# Patient Record
Sex: Male | Born: 1990 | Race: White | Hispanic: No | Marital: Single | Smoking: Never smoker
Health system: Southern US, Community
[De-identification: ages and names within clinical notes are randomized; demographics above are authoritative.]

---

## 2011-06-17 ENCOUNTER — Ambulatory Visit
Admission: RE | Admit: 2011-06-17 | Discharge: 2011-06-17 | Disposition: A | Discharge status: Home / Self Care | Payer: BC Managed Care – PPO | Source: Ambulatory Visit | Attending: Family Medicine | Admitting: Family Medicine

## 2011-06-17 ENCOUNTER — Other Ambulatory Visit: Payer: Self-pay | Admitting: Family Medicine

## 2011-06-17 DIAGNOSIS — R52 Pain, unspecified: Secondary | ICD-10-CM

## 2011-06-30 ENCOUNTER — Ambulatory Visit (INDEPENDENT_AMBULATORY_CARE_PROVIDER_SITE_OTHER): Payer: BC Managed Care – PPO | Admitting: Internal Medicine

## 2011-06-30 ENCOUNTER — Encounter: Payer: Self-pay | Admitting: Internal Medicine

## 2011-06-30 ENCOUNTER — Other Ambulatory Visit (INDEPENDENT_AMBULATORY_CARE_PROVIDER_SITE_OTHER): Payer: BC Managed Care – PPO

## 2011-06-30 DIAGNOSIS — R0609 Other forms of dyspnea: Secondary | ICD-10-CM

## 2011-06-30 DIAGNOSIS — R0989 Other specified symptoms and signs involving the circulatory and respiratory systems: Secondary | ICD-10-CM

## 2011-06-30 DIAGNOSIS — R0600 Dyspnea, unspecified: Secondary | ICD-10-CM

## 2011-06-30 DIAGNOSIS — R079 Chest pain, unspecified: Secondary | ICD-10-CM

## 2011-06-30 LAB — BRAIN NATRIURETIC PEPTIDE: Pro B Natriuretic peptide (BNP): 4 pg/mL (ref 0.0–100.0)

## 2011-06-30 MED ORDER — PANTOPRAZOLE SODIUM 40 MG PO TBEC: 40.0000 mg | DELAYED_RELEASE_TABLET | Freq: Every day | ORAL | Status: DC

## 2011-06-30 MED ORDER — FAMOTIDINE 20 MG PO TABS: ORAL_TABLET | ORAL | Status: DC

## 2011-06-30 NOTE — Progress Notes (Signed)
  Subjective:    Patient ID: Jeffrey Ponce, male    DOB: 1991/06/21, 20 y.o.   MRN: [id number]  HPI  73 yowm never smoker with excellent ex tolerance entire life including competitive aerobics until abupt onset sob on Jun 10 2011 referred by Dr Yetta Barre for pulmonary eval   06/30/2011 1st pulmonary eval cc new acute onset last week  in Oct 29th nausea / light headed limited usual  Lifting assoc nausea and then persistent  Fatigue - nausea resolved unless resumed wt lifting , also couldn't run the usual 12 x 40 yards,   3 x 60 yards> same problem.  Then for several days Prior to day of OV   chest discomfort anteriorly worse with cough x sev minutes no nausea no sweating.  Sob occursmore  talking and lying down, not so much sitting still and not proportionate to activity.no overt hb or sinus symptoms   Played baseball all fall with intense/duration wts starting early October  Sleeping ok without nocturnal  or early am exacerbation  of respiratory  c/o'sAlso denies any obvious fluctuation of symptoms with weather or environmental changes or other aggravating or alleviating factors except as outlined above    Review of Systems  Constitutional: Negative for fever, chills, activity change, appetite change and unexpected weight change.  HENT: Negative for congestion, sore throat, rhinorrhea, sneezing, trouble swallowing, dental problem, voice change and postnasal drip.   Eyes: Negative for visual disturbance.  Respiratory: Positive for shortness of breath. Negative for choking.   Cardiovascular: Positive for chest pain. Negative for leg swelling.  Gastrointestinal: Positive for abdominal pain. Negative for nausea and vomiting.  Genitourinary: Negative for difficulty urinating.  Musculoskeletal: Negative for arthralgias.  Skin: Positive for rash.  Psychiatric/Behavioral: Negative for behavioral problems and confusion.       Objective:   Physical Exam  Anxious wm nad  Wt 160 06/30/2011    HEENT: nl dentition, turbinates, and orophanx. Nl external ear canals without cough reflex   NECK :  without JVD/Nodes/TM/ nl carotid upstrokes bilaterally   LUNGS: no acc muscle use, clear to A and P bilaterally without cough on insp or exp maneuvers   CV:  RRR  no s3 or murmur or increase in P2, no edema   ABD:  soft and nontender with nl excursion in the supine position. No bruits or organomegaly, bowel sounds nl  MS:  warm without deformities, calf tenderness, cyanosis or clubbing    06/13/11 labs nl including cbc, bmet and  tsh  06/17/11 CTangiogram Chest  wnl       Assessment & Plan:

## 2011-06-30 NOTE — Patient Instructions (Signed)
Try protonix 40 mg (generic ok) Take 30-60 min before first meal of the day and Pepcid 20 mg one bedtime for at least a month   GERD (REFLUX)  is an extremely common cause of respiratory symptoms, many times with no significant heartburn at all.    It can be treated with medication, but also with lifestyle changes including avoidance of late meals, excessive alcohol, smoking cessation, and avoid fatty foods, chocolate, peppermint, colas, red wine, and acidic juices such as orange juice.  NO MINT OR MENTHOL PRODUCTS SO NO COUGH DROPS  USE SUGARLESS CANDY INSTEAD (jolley ranchers or Stover's)  NO OIL BASED VITAMINS - use powdered substitutes.    If not satisfied x 2 weeks then the next step is CPST with lung function tests before and after - call this number to schedule [phone] and ask for [facility] to resume exercise but never to the point where you are out of breath and push to 30 minutes

## 2011-06-30 NOTE — Assessment & Plan Note (Signed)
Nl labs including cbc, nl ekg p walk x 3 laps here, not able to reproduce symptoms but not typically occuring walking.   Symptoms are markedly disproportionate to objective findings and not clear this is a lung problem but pt does appear to have difficult airway management issues.  DDX of  difficult airways managment all start with A and  include Adherence, Ace Inhibitors, Acid Reflux, Active Sinus Disease, Alpha 1 Antitripsin deficiency, Anxiety masquerading as Airways dz,  ABPA,  allergy(esp in young), Aspiration (esp in elderly), Adverse effects of DPI,  Active smokers, plus two Bs  = Bronchiectasis and Beta blocker use..and one C= CHF  Acid reflux related to valsalva during ex the most likely explanation for these symptoms with anxiety also a concern but a dx of exclusion here  See instructions for specific recommendations which were reviewed directly with the patient who was given a copy with highlighter outlining the key components. cpst p minimum of 2 weeks acid suppression.

## 2011-07-01 ENCOUNTER — Encounter: Payer: Self-pay | Admitting: *Deleted

## 2011-07-01 NOTE — Progress Notes (Signed)
Quick Note:  ATC pt x 2 -unable to reach so mailed letter. ______

## 2011-07-09 ENCOUNTER — Telehealth: Payer: Self-pay | Admitting: Internal Medicine

## 2011-07-09 NOTE — Telephone Encounter (Signed)
Pt called back.  Spoke with patient who c/o severe HA, nausea, diarrhea, joint pain, body aches, SOB and chills approx 2 days after starting the pepcid and protonix prescribed at last ov on 11.19.12.  Pt stated he has not taken either med today and symptoms have persisted.  Pt also reports that he has been taking in plenty of fluids to prevent dehydration.  MW gone for the day, will for to doc of the day.  KC please advise, thanks.

## 2011-07-09 NOTE — Telephone Encounter (Signed)
Pt requests to be contacted by someone today.  Jeffrey Ponce

## 2011-07-09 NOTE — Telephone Encounter (Signed)
He can stop the medication for acid reflux He may just have an acute viral illness.  Push fluids, bland diet to keep strength up, take tylenol or advil for discomfort and any fever. Let us know if not improving next 48 hrs, and if worsens during the night go to ER. Please cc this message to Dr. Sherene Sires.

## 2011-07-09 NOTE — Telephone Encounter (Signed)
ATC x5, line busy.  Called spoke with pt's father to either get alternate number or relay message.  Father gave me girlfriend's number to try as they are most likely together.  Called pt's gf, and was able to speak with patient.  Advised of KC's recs to push fluids, bland diet, tylenol/advil for aches/fever and to call if no better by Friday and go to ER if worsens over night.  Pt verbalized his understanding and denied any questions.  Pt did ask that MW be made aware that he has been unable to exercise d/t these symptoms.  Will sign off on message and forward to MW as Lorain Childes.

## 2011-07-09 NOTE — Telephone Encounter (Signed)
ATC pt x2, line busy 

## 2011-07-10 ENCOUNTER — Telehealth: Payer: Self-pay | Admitting: Internal Medicine

## 2011-07-10 DIAGNOSIS — R0600 Dyspnea, unspecified: Secondary | ICD-10-CM

## 2011-07-10 NOTE — Telephone Encounter (Signed)
I had tried calling him with lab results and had to mail him a letter- labs were normal. Instructions were if not improving to call for CPST, but since he stopped PPI therapy unsure what MW will want to do- ATC pt at the number provided and had to [facility].

## 2011-07-10 NOTE — Telephone Encounter (Signed)
Called and spoke with pt.  Informed him of lab results.  Pt verbalized understanding.  Pt states he wanted to know what MW's next steps were regarding testing, etc since he stopped PPI therapy. Pt did want to note that while on PPI therapy he stated he saw no changes in his breathing- no better or worse.  Pt aware MW out of office until tomorrow and is ok to wait for a response until he returns.  MW, please advise.

## 2011-07-11 NOTE — Telephone Encounter (Signed)
Could not tolerate ppi or pepcid > sleepy, drugged feeling, nausea and vomiting.  Resolved off rx  Next step is cpst with before and after pft's but we'll have to do this s treating the reflux first - not ideal but no other option

## 2011-07-11 NOTE — Telephone Encounter (Signed)
Pt calling again in reference to previous message.Jeffrey Ponce

## 2011-07-17 ENCOUNTER — Ambulatory Visit (HOSPITAL_COMMUNITY): Payer: BC Managed Care – PPO | Attending: Internal Medicine

## 2011-07-17 ENCOUNTER — Other Ambulatory Visit (HOSPITAL_COMMUNITY): Payer: Self-pay | Admitting: Radiology

## 2011-07-17 DIAGNOSIS — R0600 Dyspnea, unspecified: Secondary | ICD-10-CM

## 2011-07-17 DIAGNOSIS — R0602 Shortness of breath: Secondary | ICD-10-CM

## 2011-07-18 ENCOUNTER — Ambulatory Visit (HOSPITAL_COMMUNITY): Payer: BC Managed Care – PPO | Attending: Cardiology | Admitting: Radiology

## 2011-07-18 DIAGNOSIS — I079 Rheumatic tricuspid valve disease, unspecified: Secondary | ICD-10-CM | POA: Insufficient documentation

## 2011-07-18 DIAGNOSIS — I059 Rheumatic mitral valve disease, unspecified: Secondary | ICD-10-CM | POA: Insufficient documentation

## 2011-07-18 DIAGNOSIS — R0609 Other forms of dyspnea: Secondary | ICD-10-CM | POA: Insufficient documentation

## 2011-07-18 DIAGNOSIS — R0600 Dyspnea, unspecified: Secondary | ICD-10-CM

## 2011-07-18 DIAGNOSIS — R0989 Other specified symptoms and signs involving the circulatory and respiratory systems: Secondary | ICD-10-CM

## 2011-07-21 ENCOUNTER — Telehealth: Payer: Self-pay | Admitting: Internal Medicine

## 2011-07-21 ENCOUNTER — Encounter (HOSPITAL_COMMUNITY): Payer: Self-pay | Admitting: Sports Medicine

## 2011-07-21 NOTE — Telephone Encounter (Signed)
Echo faxed to [facility] @ [phone] 07/21/11/km

## 2011-07-30 ENCOUNTER — Telehealth (HOSPITAL_COMMUNITY): Payer: Self-pay | Admitting: *Deleted

## 2011-07-30 DIAGNOSIS — R0600 Dyspnea, unspecified: Secondary | ICD-10-CM

## 2011-07-30 NOTE — Telephone Encounter (Signed)
Per Dr Gala Romney pt needs cardiac CT, spoke w/pt last week and he has already went home for the holidays and will return 12/26, will scheduled CT for afgter that if Shirlee Latch and Eden Emms are on vacation then Dr Milana Na will do CT

## 2011-08-08 ENCOUNTER — Ambulatory Visit (HOSPITAL_COMMUNITY)
Admission: RE | Admit: 2011-08-08 | Discharge: 2011-08-08 | Disposition: A | Discharge status: Home / Self Care | Payer: BC Managed Care – PPO | Source: Ambulatory Visit | Attending: Internal Medicine | Admitting: Internal Medicine

## 2011-08-08 DIAGNOSIS — R0602 Shortness of breath: Secondary | ICD-10-CM

## 2011-08-08 DIAGNOSIS — R0600 Dyspnea, unspecified: Secondary | ICD-10-CM

## 2011-08-08 DIAGNOSIS — R079 Chest pain, unspecified: Secondary | ICD-10-CM

## 2011-08-08 MED ORDER — METOPROLOL TARTRATE 1 MG/ML IV SOLN
5.0000 mg | INTRAVENOUS | Status: DC | PRN
  Administered 2011-08-08 (×2): 5 mg via INTRAVENOUS
  Filled 2011-08-08: qty 5

## 2011-08-08 MED ORDER — METOPROLOL TARTRATE 1 MG/ML IV SOLN
INTRAVENOUS | Status: AC
  Administered 2011-08-08: 5 mg via INTRAVENOUS
  Filled 2011-08-08: qty 20

## 2011-08-08 MED ORDER — NITROGLYCERIN 0.4 MG SL SUBL
SUBLINGUAL_TABLET | SUBLINGUAL | Status: AC
  Administered 2011-08-08: 0.4 mg
  Filled 2011-08-08: qty 25

## 2011-08-08 MED ORDER — IOHEXOL 350 MG/ML SOLN
80.0000 mL | Freq: Once | INTRAVENOUS | Status: AC | PRN
  Administered 2011-08-08: 80 mL via INTRAVENOUS

## 2011-08-28 ENCOUNTER — Ambulatory Visit (INDEPENDENT_AMBULATORY_CARE_PROVIDER_SITE_OTHER): Payer: BC Managed Care – PPO | Admitting: Pulmonary Disease

## 2011-08-28 ENCOUNTER — Encounter: Payer: Self-pay | Admitting: Pulmonary Disease

## 2011-08-28 VITALS — BP 122/64 | HR 77 | Temp 98.1°F | Ht 69.0 in | Wt 162.8 lb

## 2011-08-28 DIAGNOSIS — R0989 Other specified symptoms and signs involving the circulatory and respiratory systems: Secondary | ICD-10-CM

## 2011-08-28 DIAGNOSIS — R0600 Dyspnea, unspecified: Secondary | ICD-10-CM

## 2011-08-28 NOTE — Progress Notes (Signed)
  Subjective:    Patient ID: Jeffrey Ponce, male    DOB: 22-Mar-1991, 20 y.o.   MRN: [id number]  HPI Patient is a 21 year old male who I've been asked to see for dyspnea.  He was in his usual state of health and doing well until October of last year when he began to have dyspnea on exertion.  This worsened to the point that he was dyspneic even at rest.  The patient has had a cardiopulmonary stress test which showed a decrease in his flows immediately after exercise, but then improved over time.  Typically, exercise-induced asthma shows a decline approximately 10-15 minutes after exercise.  He has had an echocardiogram which showed an ejection fraction of 45-50%, and this was followed by a cardiac CT that was unremarkable.  He has also had a negative CT chest in November of last year.  The patient was treated for possible exercised induced asthma with albuterol as needed.  There was a question of mold exposure in his environment, and since moving to a different setting he feels that he has improved by 50%.  However, he still gets dyspneic even with a slow jog.  He is using his albuterol before exercise, and also as needed.  Unfortunately, he is using this 4-5 times a day.  He thinks the inhaler helps merrily when he is doing intense exercise.  The patient denies any history of cough or mucus production, and has nothing to suggest muscle weakness.  He has no history of childhood asthma.   Review of Systems  Constitutional: Negative.  Negative for fever and unexpected weight change.  HENT: Negative.  Negative for ear pain, nosebleeds, congestion, sore throat, rhinorrhea, sneezing, trouble swallowing, dental problem, postnasal drip and sinus pressure.   Eyes: Negative.  Negative for redness and itching.  Respiratory: Positive for chest tightness and shortness of breath. Negative for cough and wheezing.   Cardiovascular: Negative.  Negative for palpitations and leg swelling.  Gastrointestinal: Negative.   Negative for nausea and vomiting.  Genitourinary: Negative.  Negative for dysuria.  Musculoskeletal: Negative.  Negative for joint swelling.  Skin: Negative.  Negative for rash.  Neurological: Negative.  Negative for headaches.  Hematological: Negative.  Does not bruise/bleed easily.  Psychiatric/Behavioral: Negative.  Negative for dysphoric mood. The patient is not nervous/anxious.        Objective:   Physical Exam Constitutional:  Well developed, no acute distress  HENT:  Nares patent without discharge  Oropharynx without exudate, palate and uvula are normal  Eyes:  Perrla, eomi, no scleral icterus  Neck:  No JVD, no TMG  Cardiovascular:  Normal rate, regular rhythm, no rubs or gallops.  No murmurs        Intact distal pulses  Pulmonary :  Normal breath sounds, no stridor or respiratory distress   No rales, rhonchi, or wheezing  Abdominal:  Soft, nondistended, bowel sounds present.  No tenderness noted.   Musculoskeletal:  No lower extremity edema noted.  Lymph Nodes:  No cervical lymphadenopathy noted  Skin:  No cyanosis noted  Neurologic:  Alert, appropriate, moves all 4 extremities without obvious deficit.         Assessment & Plan:

## 2011-08-28 NOTE — Patient Instructions (Signed)
Will schedule for methacholine challenge testing. Will call with results when available. Can use albuterol up to 12hrs before testing.

## 2011-08-28 NOTE — Assessment & Plan Note (Signed)
The patient has a history that is somewhat suggestive of exercise-induced asthma, however spirometry during his cardiopulmonary stress test is really not diagnostic. He feels that his breathing has definitely improved since being out of his environment with mold, but is still using his albuterol inhaler 4-5 times per day.  I am most concerned that he may have true asthma, which will require more aggressive treatment with inhaled corticosteroids for life.  The other possibility is that he developed reactive airways disease when being in the mold environment, and now is improving since in a different environment?  I really think we need to put the issue of asthma to rest, and would therefore recommend a methacholine challenge test.  The patient is agreeable to this approach.  I am still a little puzzled by his echo which showed an ejection fraction of 45-50%.  He has had a negative cardiac CT, but I do not know whether this truly addresses the ejection fraction issue.  I will leave that to his primary care physician.

## 2011-09-03 ENCOUNTER — Other Ambulatory Visit (HOSPITAL_COMMUNITY): Payer: Self-pay | Admitting: Radiology

## 2011-09-03 ENCOUNTER — Ambulatory Visit (HOSPITAL_COMMUNITY)
Admission: RE | Admit: 2011-09-03 | Discharge: 2011-09-03 | Disposition: A | Discharge status: Home / Self Care | Payer: BC Managed Care – PPO | Source: Ambulatory Visit | Attending: Pulmonary Disease | Admitting: Pulmonary Disease

## 2011-09-03 DIAGNOSIS — R0600 Dyspnea, unspecified: Secondary | ICD-10-CM

## 2011-09-03 DIAGNOSIS — R0609 Other forms of dyspnea: Secondary | ICD-10-CM | POA: Insufficient documentation

## 2011-09-03 DIAGNOSIS — R0989 Other specified symptoms and signs involving the circulatory and respiratory systems: Secondary | ICD-10-CM | POA: Insufficient documentation

## 2011-09-03 MED ORDER — ALBUTEROL SULFATE (5 MG/ML) 0.5% IN NEBU
2.5000 mg | INHALATION_SOLUTION | Freq: Once | RESPIRATORY_TRACT | Status: AC
  Administered 2011-09-03: 2.5 mg via RESPIRATORY_TRACT

## 2011-09-03 MED ORDER — METHACHOLINE 0.25 MG/ML NEB SOLN
2.0000 mL | Freq: Once | RESPIRATORY_TRACT | Status: AC
  Administered 2011-09-03: 0.5 mg via RESPIRATORY_TRACT

## 2011-09-03 MED ORDER — SODIUM CHLORIDE 0.9 % IN NEBU
3.0000 mL | INHALATION_SOLUTION | Freq: Once | RESPIRATORY_TRACT | Status: AC
  Administered 2011-09-03: 3 mL via RESPIRATORY_TRACT

## 2011-09-03 MED ORDER — METHACHOLINE 0.0625 MG/ML NEB SOLN
2.0000 mL | Freq: Once | RESPIRATORY_TRACT | Status: AC
  Administered 2011-09-03: 0.125 mg via RESPIRATORY_TRACT

## 2011-09-03 MED ORDER — METHACHOLINE 16 MG/ML NEB SOLN: 2.0000 mL | Freq: Once | RESPIRATORY_TRACT | Status: AC

## 2011-09-03 MED ORDER — METHACHOLINE 4 MG/ML NEB SOLN
2.0000 mL | Freq: Once | RESPIRATORY_TRACT | Status: AC
  Administered 2011-09-03: 8 mg via RESPIRATORY_TRACT

## 2011-09-03 MED ORDER — METHACHOLINE 1 MG/ML NEB SOLN
2.0000 mL | Freq: Once | RESPIRATORY_TRACT | Status: AC
  Administered 2011-09-03: 2 mg via RESPIRATORY_TRACT

## 2013-09-19 ENCOUNTER — Encounter: Payer: Self-pay | Admitting: Sports Medicine

## 2013-09-19 ENCOUNTER — Ambulatory Visit (INDEPENDENT_AMBULATORY_CARE_PROVIDER_SITE_OTHER): Payer: Self-pay | Admitting: Sports Medicine

## 2013-09-19 VITALS — BP 127/74 | Ht 69.0 in | Wt 160.0 lb

## 2013-09-19 DIAGNOSIS — Z0000 Encounter for general adult medical examination without abnormal findings: Secondary | ICD-10-CM

## 2013-09-19 NOTE — Progress Notes (Addendum)
   Subjective:    Patient ID: Jeffrey Ponce, male    DOB: 11/24/1990, 23 y.o.   MRN: [id number]  HPI chief complaint: "I need some paperwork filled out for [place] [employer]"  23 year old healthy male comes in today requesting some paperwork to be completed. He is applying for employment with [employer]. He is scheduled to start July 1. 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 He is currently without complaint. He is otherwise healthy. He does have a history of dyspnea in late 2012. He was evaluated by a pulmonologist and although no specific diagnosis was given, his symptoms eventually resolved. In fact, he was able to complete a 7 mile run a few days ago without any difficulty. No longer getting shortness of breath. No fevers. No chest pain. Surgical history is significant for an ORIF of a distal radius fracture in 2009. He takes no chronic medications He is allergic to pantoprazole    Review of Systems     Objective:   Physical Exam Well-developed, fit-appearing. No acute distress. Awake alert and oriented x3. Blood pressure is 127/74.  HEENT: Normocephalic, atraumatic. Pupils equal, round, and reactive to light and accommodation Cardiovascular: Regular rate. No murmurs, rubs, or gallops. Lungs: Clear to auscultation bilaterally. No rhonchi, rales, or wheezes. Abdomen: Benign Extremities: No edema MSK: Patient has full range of motion and full-strength in all joints.       Assessment & Plan:  Healthy 23 year old male  [place] [employer] pre[place] physician statement form was completed and is available for review on the chart. No restrictions on activity. Followup when necessary.

## 2013-11-23 IMAGING — CT CT HEART MORP W/ CTA COR W/ SCORE W/ CA W/CM &/OR W/O CM
1 of 5 series · 11 of 20 positions shown, 14 images · non-contrast
Comparison: CT 06/17/2011.

<!--  IDXRADR:ADDEND:BEGIN -->Addendum Begins
<!--  IDXRADR:ADDEND:INNER_BEGIN -->***ADDENDUM*** CREATED: 08/14/2011 [DATE]

OVER-READ INTERPRETATION - CT CHEST
The following report is an over-read performed by radiologist Dr.
[DATE].  This over-read does not include interpretation of
cardiac or coronary anatomy or pathology.  The CTA interpretation
by the cardiologist is attached.
PROTOCOL: The patient scanned on a [employer]ips [REDACTED]ice scanner.
Prospective gating with 5% phase tolerance at 100 kV was used.   15
mg IV total of Lopressor was administered.  Average heart rate
during the scan was 60 beats per minute.  After an initial AP and
lateral topogram, 3 mm axial slices were performed through the
heart for calcium scoring. The patient then had a 80 ml bolus of
contrast given for coronary CTA with bolus tracking in the
descending thoracic aorta.  The 3-D data set was then sent to the
[employer] workstation.  Reconstructions were done using MIP,MPR and
VRT modes.

[Series 8: w/ edge cor., 78.0% · axial · 0.49mm/px · z∈[-215,-112]mm · 11 of 276 slices shown, 14 images]
[im 23/276  vessel]
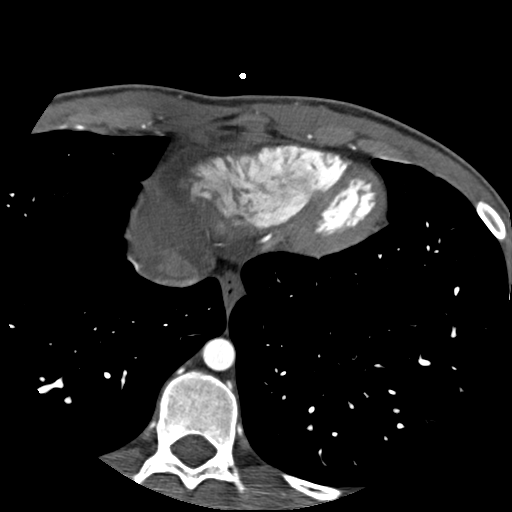
[im 23/276  lung]
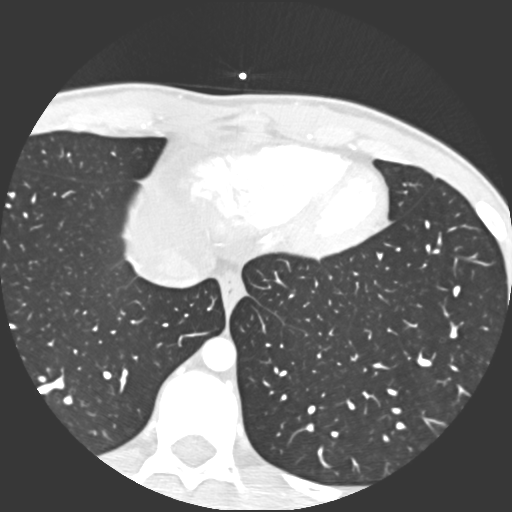
[im 46/276  vessel]
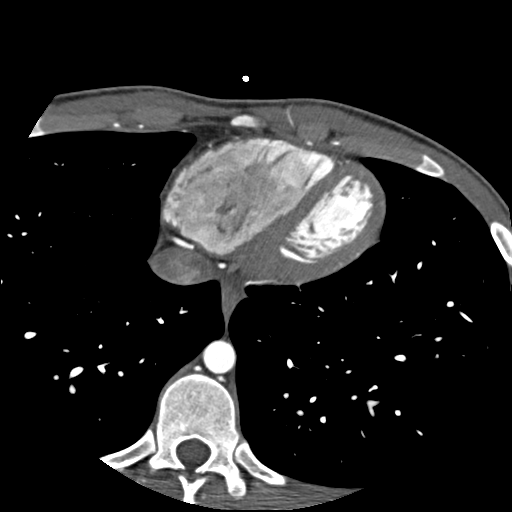
[im 69/276  vessel]
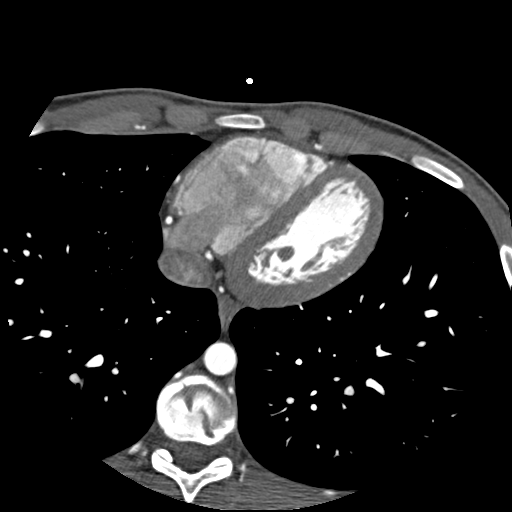
[im 92/276  vessel]
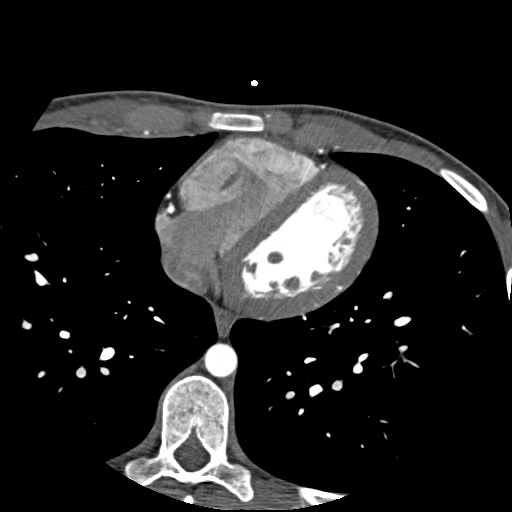
[im 115/276  vessel]
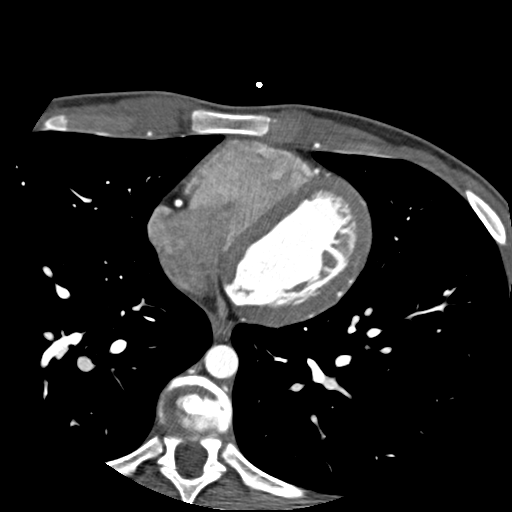
[im 115/276  lung]
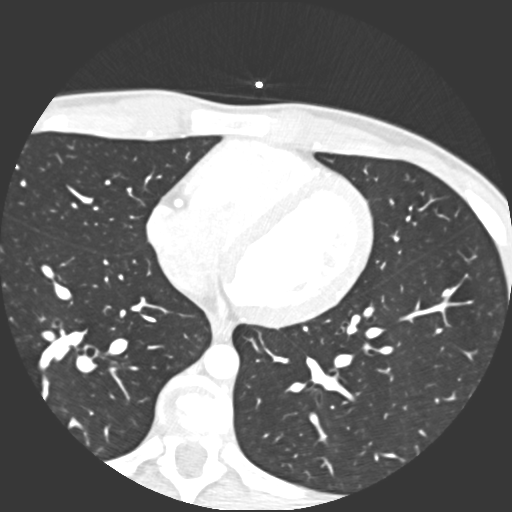
[im 138/276  vessel]
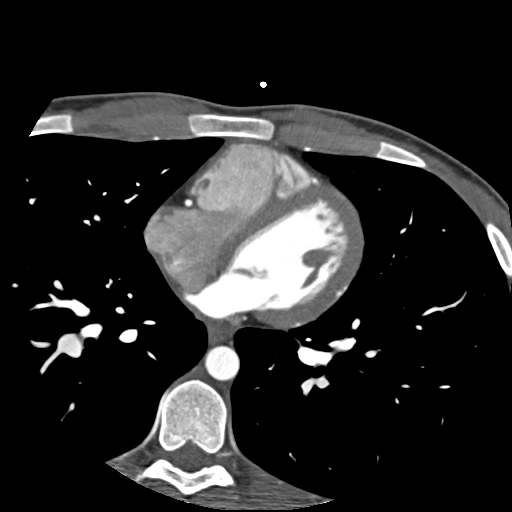
[im 161/276  vessel]
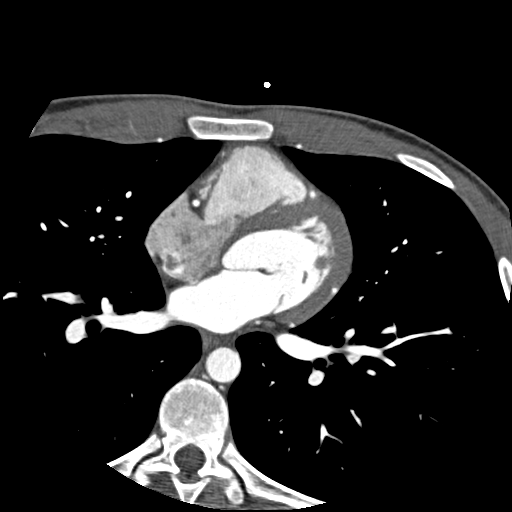
[im 184/276  vessel]
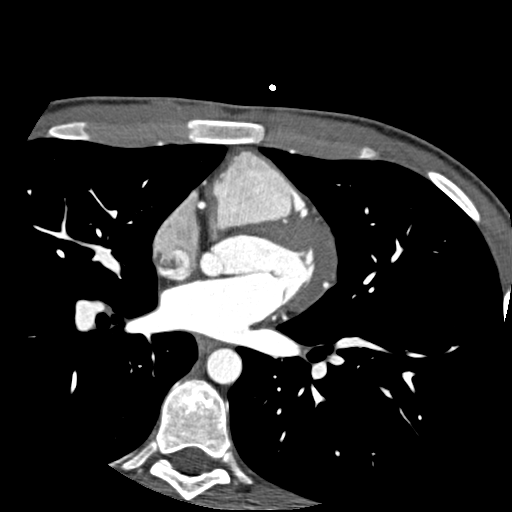
[im 207/276  vessel]
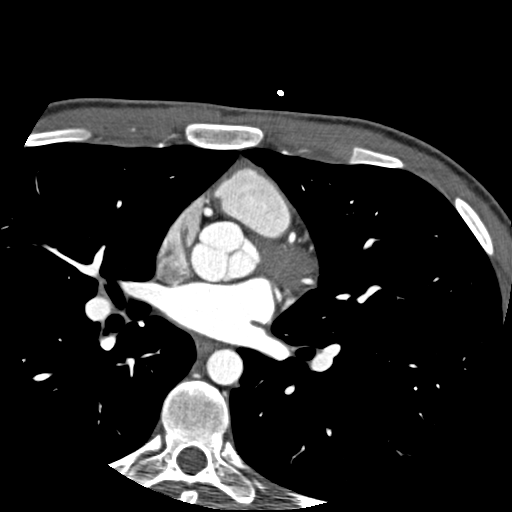
[im 207/276  lung]
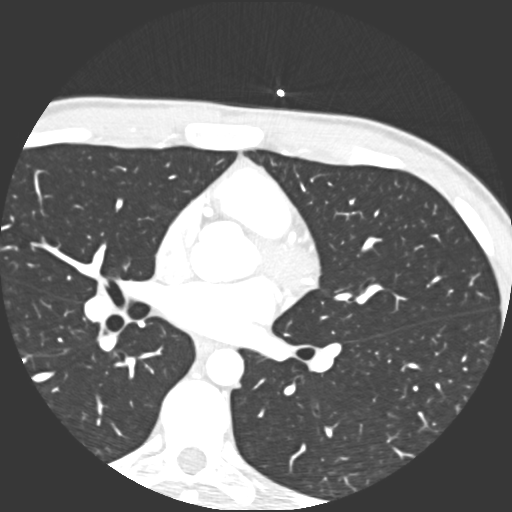
[im 230/276  vessel]
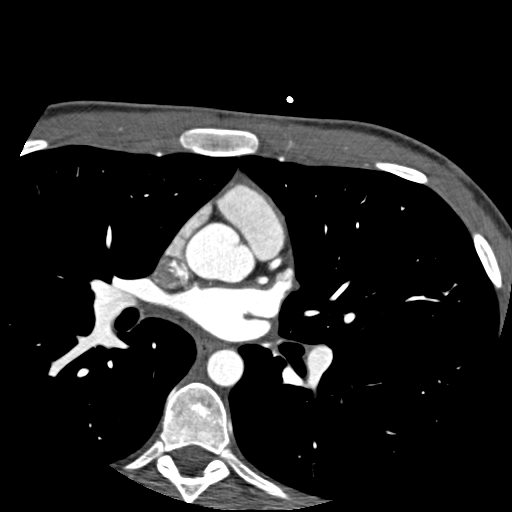
[im 253/276  vessel]
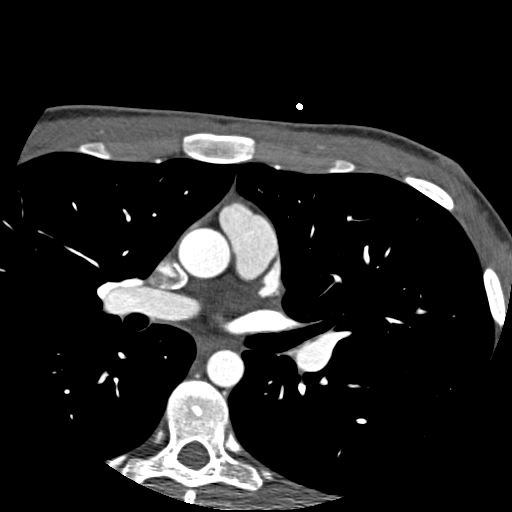

[11 of 20 positions shown; findings below may reference images not displayed]

FINDINGS: Lung windows demonstrate a 3 mm right lower lobe lung
nodule on image 36 of series 4.  Likely post infectious or
inflammatory in this 20-year-old patient.  4 mm left lower lobe
nodule on image 38 is likely a subpleural lymph node, given
morphology.

Soft tissue windows demonstrate no mediastinal or hilar adenopathy.
No central pulmonary embolism, on this non-dedicated study.  No
evidence of aortic dissection or aneurysm.

Limited abdominal imaging demonstrates no significant findings.  No
acute osseous abnormality.
IMPRESSION: No acute or significant extracardiac findings within the chest.
Tiny bilateral lung nodules which are likely benign subpleural
lymph nodes, given patient age.


CARDIAC CTA WITH CALCIUM SCORE 08/08/2011 [DATE]

Ordering Physician: EDWING GEER

[place] Physician: Mika Julie Jamie-Lee
Indications: Chest pain

DETAILED FINDINGS:

Quality of Study: Good

Normal coronary origins, no anomalous vessels.

Left Main: No plaque or stenosis

Left Anterior Descending: No plaque or stenosis

Left Circumflex: Large OM1, small OM2, no plaque or stenosis.

Right Coronary Artery: Dominant vessel with no plaque or stenosis.

Coronary Calcium Score: 0 Agatston units

Aorta: Normal caliber aortic root.
IMPRESSION: 1. No coronary anomalies.

2. No coronary stenoses or plaque noted.

<!--  IDXRADR:ADDEND:INNER_END -->Addendum Ends
<!--  IDXRADR:ADDEND:END -->*RADIOLOGY REPORT*

CARDIAC CTA WITH CALCIUM SCORE 08/08/2011 [DATE]

Ordering Physician: EDWING GEER

[place] Physician: Mika Julie Jamie-Lee
Indications: Chest pain

DETAILED FINDINGS:

Quality of Study: Good

Normal coronary origins, no anomalous vessels.

Left Main: No plaque or stenosis

Left Anterior Descending: No plaque or stenosis

Left Circumflex: Large OM1, small OM2, no plaque or stenosis.

Right Coronary Artery: Dominant vessel with no plaque or stenosis.

Coronary Calcium Score: 0 Agatston units

Aorta: Normal caliber aortic root.
IMPRESSION: 1. No coronary anomalies.

2. No coronary stenoses or plaque noted.
